# Patient Record
Sex: Male | Born: 2000 | Hispanic: Yes | Marital: Single | Smoking: Current every day smoker
Health system: Southern US, Community
[De-identification: ages and names within clinical notes are randomized; demographics above are authoritative.]

## PROBLEM LIST (undated history)

## (undated) DIAGNOSIS — W3400XA Accidental discharge from unspecified firearms or gun, initial encounter: Secondary | ICD-10-CM

---
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
## 2020-07-09 ENCOUNTER — Emergency Department (HOSPITAL_COMMUNITY): Payer: Medicaid Other

## 2020-07-09 ENCOUNTER — Emergency Department (HOSPITAL_COMMUNITY)
Admission: EM | Admit: 2020-07-09 | Discharge: 2020-07-09 | Disposition: A | Discharge status: Home / Self Care | Payer: Medicaid Other | Attending: Emergency Medicine | Admitting: Emergency Medicine

## 2020-07-09 ENCOUNTER — Encounter (HOSPITAL_COMMUNITY): Payer: Self-pay | Admitting: Emergency Medicine

## 2020-07-09 DIAGNOSIS — R0789 Other chest pain: Secondary | ICD-10-CM | POA: Diagnosis not present

## 2020-07-09 DIAGNOSIS — F419 Anxiety disorder, unspecified: Secondary | ICD-10-CM | POA: Diagnosis not present

## 2020-07-09 DIAGNOSIS — F17200 Nicotine dependence, unspecified, uncomplicated: Secondary | ICD-10-CM | POA: Insufficient documentation

## 2020-07-09 HISTORY — DX: Accidental discharge from unspecified firearms or gun, initial encounter: W34.00XA

## 2020-07-09 LAB — BASIC METABOLIC PANEL
Anion gap: 14 (ref 5–15)
BUN: 10 mg/dL (ref 6–20)
CO2: 23 mmol/L (ref 22–32)
Calcium: 9.1 mg/dL (ref 8.9–10.3)
Chloride: 102 mmol/L (ref 98–111)
Creatinine, Ser: 0.76 mg/dL (ref 0.61–1.24)
GFR calc Af Amer: >60 mL/min (ref >60)
GFR calc non Af Amer: >60 mL/min (ref >60)
Glucose, Bld: 80 mg/dL (ref 70–99)
Potassium: 3 mmol/L — ABNORMAL LOW (ref 3.5–5.1)
Sodium: 139 mmol/L (ref 135–145)

## 2020-07-09 LAB — CBC
HCT: 42 % (ref 39.0–52.0)
Hemoglobin: 14 g/dL (ref 13.0–17.0)
MCH: 30.3 pg (ref 26.0–34.0)
MCHC: 33.3 g/dL (ref 30.0–36.0)
MCV: 90.9 fL (ref 80.0–100.0)
Platelets: 284 10*3/uL (ref 150–400)
RBC: 4.62 MIL/uL (ref 4.22–5.81)
RDW: 12.5 % (ref 11.5–15.5)
WBC: 5.6 10*3/uL (ref 4.0–10.5)
nRBC: 0 % (ref 0.0–0.2)

## 2020-07-09 LAB — TROPONIN I (HIGH SENSITIVITY)
Troponin I (High Sensitivity): 2 ng/L (ref <18)
Troponin I (High Sensitivity): 3 ng/L (ref <18)

## 2020-07-09 MED ORDER — SODIUM CHLORIDE 0.9% FLUSH: 3.0000 mL | Freq: Once | INTRAVENOUS | Status: DC

## 2020-07-09 NOTE — ED Notes (Signed)
Pt called x 3  No answer. 
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
## 2022-03-25 IMAGING — CR DG CHEST 1V PORT
1 series · 1 of 1 positions shown · non-contrast
Comparison: None.

CLINICAL DATA: Chest pain

EXAM:
PORTABLE CHEST 1 VIEW

[chest ap]
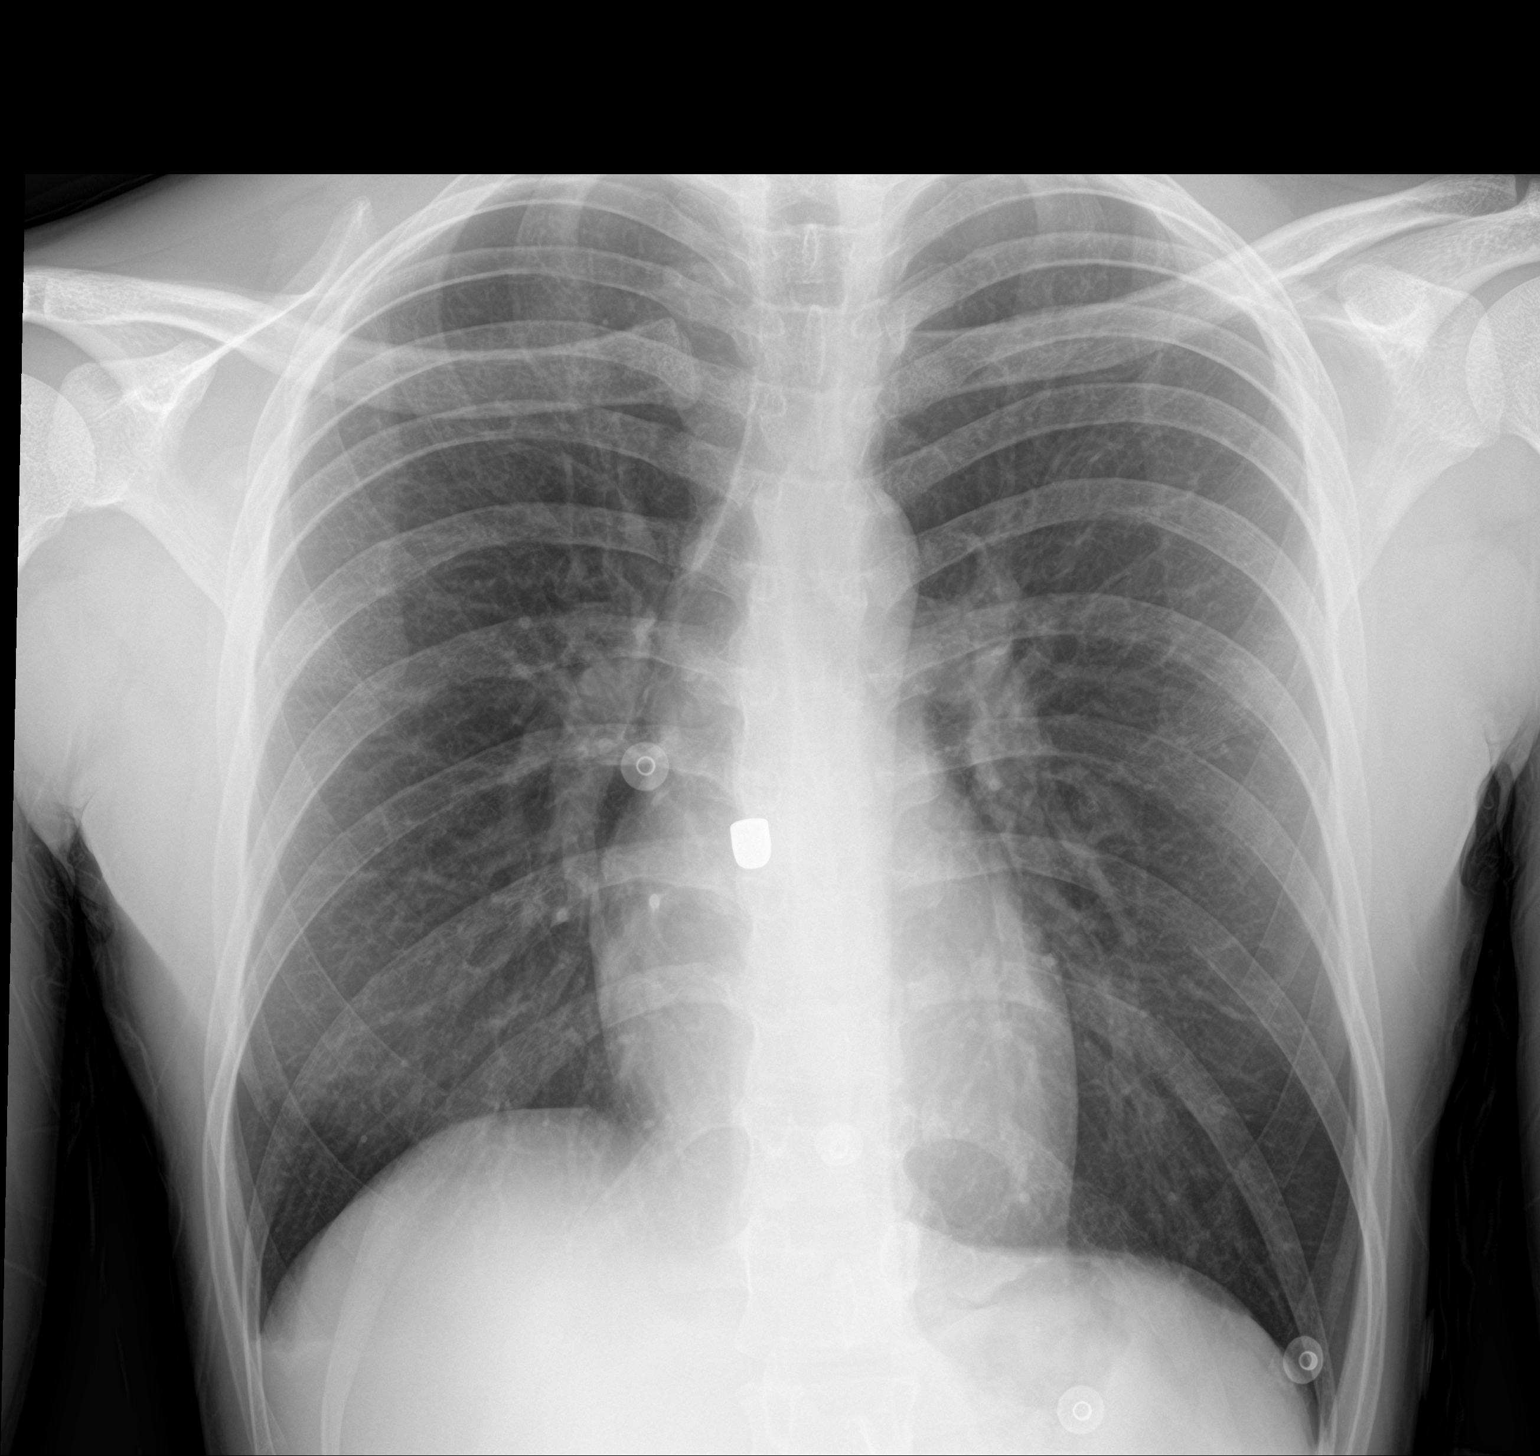

[1 of 1 positions shown; findings below may reference images not displayed]

FINDINGS: The heart size and mediastinal contours are within normal limits.
Both lungs are clear. The visualized skeletal structures are
unremarkable.
IMPRESSION: No active disease.

## 2023-01-27 ENCOUNTER — Encounter (HOSPITAL_COMMUNITY): Payer: Self-pay | Admitting: Emergency Medicine

## 2023-01-27 ENCOUNTER — Emergency Department (HOSPITAL_COMMUNITY)
Admission: EM | Admit: 2023-01-27 | Discharge: 2023-01-28 | Disposition: A | Discharge status: Home / Self Care | Payer: Medicaid Other | Attending: Emergency Medicine | Admitting: Emergency Medicine

## 2023-01-27 ENCOUNTER — Other Ambulatory Visit: Payer: Self-pay

## 2023-01-27 DIAGNOSIS — H538 Other visual disturbances: Secondary | ICD-10-CM | POA: Diagnosis not present

## 2023-01-27 DIAGNOSIS — R220 Localized swelling, mass and lump, head: Secondary | ICD-10-CM | POA: Diagnosis not present

## 2023-01-27 DIAGNOSIS — R6884 Jaw pain: Secondary | ICD-10-CM | POA: Insufficient documentation

## 2023-01-27 DIAGNOSIS — R42 Dizziness and giddiness: Secondary | ICD-10-CM | POA: Diagnosis not present
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
## 2023-01-28 ENCOUNTER — Emergency Department (HOSPITAL_COMMUNITY): Payer: Medicaid Other
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
## 2023-02-11 ENCOUNTER — Ambulatory Visit (HOSPITAL_COMMUNITY)
Admission: EM | Admit: 2023-02-11 | Discharge: 2023-02-11 | Disposition: A | Discharge status: Home / Self Care | Payer: Medicaid Other | Attending: Psychiatry | Admitting: Psychiatry

## 2023-02-11 DIAGNOSIS — Z5900 Homelessness unspecified: Secondary | ICD-10-CM | POA: Insufficient documentation

## 2023-02-11 DIAGNOSIS — R4589 Other symptoms and signs involving emotional state: Secondary | ICD-10-CM

## 2023-02-11 DIAGNOSIS — F1920 Other psychoactive substance dependence, uncomplicated: Secondary | ICD-10-CM | POA: Insufficient documentation
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
## 2023-02-11 NOTE — Discharge Instructions (Addendum)
F/u with men shelter F/u with outpatient resources
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
## 2023-02-12 ENCOUNTER — Encounter (HOSPITAL_COMMUNITY): Payer: Self-pay

## 2023-02-12 ENCOUNTER — Emergency Department (HOSPITAL_COMMUNITY)
Admission: EM | Admit: 2023-02-12 | Discharge: 2023-02-12 | Disposition: A | Discharge status: Home / Self Care | Payer: Medicaid Other | Attending: Emergency Medicine | Admitting: Emergency Medicine

## 2023-02-12 ENCOUNTER — Other Ambulatory Visit: Payer: Self-pay

## 2023-02-12 DIAGNOSIS — F1910 Other psychoactive substance abuse, uncomplicated: Secondary | ICD-10-CM

## 2023-02-12 DIAGNOSIS — Z5900 Homelessness unspecified: Secondary | ICD-10-CM | POA: Insufficient documentation

## 2023-02-12 DIAGNOSIS — F1510 Other stimulant abuse, uncomplicated: Secondary | ICD-10-CM | POA: Insufficient documentation

## 2023-02-12 NOTE — ED Triage Notes (Signed)
Presented to [facility] searching for resources.   Pt admits to argument with wife and several home stressors.   Went to seek resources for housing, work, and help after using methamphetamines for the first time 2 days ago.   Denies si/hi.

## 2023-02-12 NOTE — ED Provider Notes (Signed)
  [place] Provider Note   CSN: [id number] Arrival date & time: 02/12/23  0002     History  Chief Complaint  Patient presents with   [facility]    Eric Abbott is a 22 y.o. male.  22 year old male presents with request for resources for detox after using crystal meth.  States that he has gotten into an argument with his wife recently and his father-in-law has asked that he not be around the family for the time being.  He has had difficulty securing work and being a productive member of his family and his relationship.  He denies suicidal or homicidal ideation.  Patient went to behavioral health urgent care but felt he was not heard so he came to the emergency room tonight.       Home Medications Prior to Admission medications   Not on File      Allergies    Shellfish allergy    Review of Systems   Review of Systems Get of except as per HPI Physical Exam Updated Vital Signs BP (!) 149/110 (BP Location: Right Arm)   Pulse 99   Temp (!) 97.5 F (36.4 C) (Oral)   Resp 18   Ht '5\' 6"'$  (1.676 m)   Wt 49.9 kg   SpO2 99%   BMI 17.75 kg/m  Physical Exam Vitals and nursing note reviewed.  Constitutional:      General: He is not in acute distress.    Appearance: He is well-developed. He is not diaphoretic.  HENT:     Head: Normocephalic and atraumatic.  Pulmonary:     Effort: Pulmonary effort is normal.  Neurological:     Mental Status: He is alert and oriented to person, place, and time.  Psychiatric:        Behavior: Behavior normal.     ED Results / Procedures / Treatments   Labs (all labs ordered are listed, but only abnormal results are displayed) Labs Reviewed - No data to display  EKG None  Radiology No results found.  Procedures Procedures    Medications Ordered in ED Medications - No data to display  ED Course/ Medical Decision Making/ A&P                              Medical Decision Making  23 year old male presents as above.  Patient feels like he has nowhere to go tonight, he has never been homeless before.  He is provided with shelter guide as well as inpatient and outpatient substance abuse resources.  He is encouraged to contact resources tomorrow and stay in touch with his family, specifically his father-in-law who he feels has been a positive role model in his life.        Final Clinical Impression(s) / ED Diagnoses Final diagnoses:  Homeless  Substance abuse [facility])    Rx / DC Orders ED Discharge Orders     None         Roque Lias 02/12/23 0226    Quintella Reichert, MD 02/12/23 [phone]

## 2023-02-20 ENCOUNTER — Emergency Department (HOSPITAL_COMMUNITY): Payer: Medicaid Other

## 2023-02-20 ENCOUNTER — Emergency Department (HOSPITAL_COMMUNITY)
Admission: EM | Admit: 2023-02-20 | Discharge: 2023-02-21 | Disposition: A | Discharge status: Home / Self Care | Payer: Medicaid Other | Attending: Emergency Medicine | Admitting: Emergency Medicine

## 2023-02-20 DIAGNOSIS — M549 Dorsalgia, unspecified: Secondary | ICD-10-CM | POA: Insufficient documentation

## 2023-02-20 DIAGNOSIS — Z5321 Procedure and treatment not carried out due to patient leaving prior to being seen by health care provider: Secondary | ICD-10-CM | POA: Diagnosis not present

## 2023-02-20 DIAGNOSIS — R5383 Other fatigue: Secondary | ICD-10-CM | POA: Insufficient documentation
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
## 2023-02-20 NOTE — ED Notes (Signed)
Pt seen walking out of department.
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
## 2024-09-07 ENCOUNTER — Emergency Department (HOSPITAL_COMMUNITY)
Admission: EM | Admit: 2024-09-07 | Discharge: 2024-09-07 | Disposition: A | Discharge status: Home / Self Care | Payer: MEDICAID | Attending: Emergency Medicine | Admitting: Emergency Medicine

## 2024-09-07 ENCOUNTER — Encounter (HOSPITAL_COMMUNITY): Payer: Self-pay

## 2024-09-07 ENCOUNTER — Other Ambulatory Visit: Payer: Self-pay

## 2024-09-07 ENCOUNTER — Emergency Department (HOSPITAL_COMMUNITY): Payer: MEDICAID

## 2024-09-07 DIAGNOSIS — Y9355 Activity, bike riding: Secondary | ICD-10-CM | POA: Insufficient documentation

## 2024-09-07 DIAGNOSIS — M79605 Pain in left leg: Secondary | ICD-10-CM | POA: Diagnosis not present

## 2024-09-07 DIAGNOSIS — M546 Pain in thoracic spine: Secondary | ICD-10-CM | POA: Diagnosis not present

## 2024-09-07 DIAGNOSIS — M25562 Pain in left knee: Secondary | ICD-10-CM | POA: Diagnosis present

## 2024-09-07 DIAGNOSIS — Y92410 Unspecified street and highway as the place of occurrence of the external cause: Secondary | ICD-10-CM | POA: Insufficient documentation

## 2024-09-07 MED ORDER — KETOROLAC TROMETHAMINE 15 MG/ML IJ SOLN
15.0000 mg | Freq: Once | INTRAMUSCULAR | Status: AC
  Administered 2024-09-07: 15 mg via INTRAVENOUS

## 2024-09-07 MED ORDER — KETOROLAC TROMETHAMINE 15 MG/ML IJ SOLN
15.0000 mg | Freq: Once | INTRAMUSCULAR | Status: DC
  Filled 2024-09-07: qty 1

## 2024-09-07 MED ORDER — OXYCODONE HCL 5 MG PO TABS
5.0000 mg | ORAL_TABLET | Freq: Once | ORAL | Status: AC
  Administered 2024-09-07: 5 mg via ORAL
  Filled 2024-09-07: qty 1

## 2024-09-07 MED ORDER — ACETAMINOPHEN 500 MG PO TABS
1000.0000 mg | ORAL_TABLET | Freq: Once | ORAL | Status: AC
  Administered 2024-09-07: 1000 mg via ORAL
  Filled 2024-09-07: qty 2
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
## 2024-09-07 NOTE — ED Notes (Signed)
Transported with pt to Xray
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
